# Patient Record
Sex: Male | Born: 1975 | Race: Black or African American | Hispanic: No | Marital: Married | Smoking: Never smoker
Health system: Southern US, Community
[De-identification: ages and names within clinical notes are randomized; demographics above are authoritative.]

---

## 2015-12-24 DIAGNOSIS — I4891 Unspecified atrial fibrillation: Principal | ICD-10-CM | POA: Diagnosis present

## 2015-12-24 DIAGNOSIS — N179 Acute kidney failure, unspecified: Secondary | ICD-10-CM | POA: Diagnosis present

## 2015-12-25 ENCOUNTER — Encounter (HOSPITAL_COMMUNITY): Payer: Self-pay

## 2015-12-25 ENCOUNTER — Emergency Department (HOSPITAL_COMMUNITY): Payer: Self-pay

## 2015-12-25 ENCOUNTER — Inpatient Hospital Stay (HOSPITAL_COMMUNITY)
Admission: EM | Admit: 2015-12-25 | Discharge: 2015-12-25 | DRG: 309 | Disposition: A | Discharge status: Home / Self Care | Payer: Self-pay | Attending: Family Medicine | Admitting: Family Medicine

## 2015-12-25 ENCOUNTER — Other Ambulatory Visit: Payer: Self-pay

## 2015-12-25 ENCOUNTER — Inpatient Hospital Stay (HOSPITAL_COMMUNITY): Payer: Self-pay

## 2015-12-25 DIAGNOSIS — I4891 Unspecified atrial fibrillation: Principal | ICD-10-CM

## 2015-12-25 DIAGNOSIS — R9431 Abnormal electrocardiogram [ECG] [EKG]: Secondary | ICD-10-CM

## 2015-12-25 DIAGNOSIS — N179 Acute kidney failure, unspecified: Secondary | ICD-10-CM

## 2015-12-25 DIAGNOSIS — R748 Abnormal levels of other serum enzymes: Secondary | ICD-10-CM | POA: Diagnosis present

## 2015-12-25 DIAGNOSIS — I471 Supraventricular tachycardia: Secondary | ICD-10-CM

## 2015-12-25 DIAGNOSIS — R0789 Other chest pain: Secondary | ICD-10-CM | POA: Diagnosis present

## 2015-12-25 DIAGNOSIS — M6282 Rhabdomyolysis: Secondary | ICD-10-CM

## 2015-12-25 LAB — I-STAT CHEM 8, ED
BUN: 32 mg/dL — ABNORMAL HIGH (ref 6–20)
CREATININE: 1.5 mg/dL — AB (ref 0.61–1.24)
Calcium, Ion: 1.13 mmol/L — ABNORMAL LOW (ref 1.15–1.40)
Chloride: 103 mmol/L (ref 101–111)
Glucose, Bld: 82 mg/dL (ref 65–99)
HEMATOCRIT: 47 % (ref 39.0–52.0)
HEMOGLOBIN: 16 g/dL (ref 13.0–17.0)
POTASSIUM: 4.6 mmol/L (ref 3.5–5.1)
SODIUM: 137 mmol/L (ref 135–145)
TCO2: 27 mmol/L (ref 0–100)

## 2015-12-25 LAB — SODIUM, URINE, RANDOM: Sodium, Ur: 72 mmol/L

## 2015-12-25 LAB — RAPID URINE DRUG SCREEN, HOSP PERFORMED
AMPHETAMINES: NOT DETECTED
BARBITURATES: NOT DETECTED
Benzodiazepines: NOT DETECTED
Cocaine: NOT DETECTED
OPIATES: NOT DETECTED
TETRAHYDROCANNABINOL: NOT DETECTED

## 2015-12-25 LAB — CBC
HEMATOCRIT: 39.5 % (ref 39.0–52.0)
Hemoglobin: 13.1 g/dL (ref 13.0–17.0)
MCH: 28.4 pg (ref 26.0–34.0)
MCHC: 33.2 g/dL (ref 30.0–36.0)
MCV: 85.7 fL (ref 78.0–100.0)
Platelets: 202 10*3/uL (ref 150–400)
RBC: 4.61 MIL/uL (ref 4.22–5.81)
RDW: 13.1 % (ref 11.5–15.5)
WBC: 7.2 10*3/uL (ref 4.0–10.5)

## 2015-12-25 LAB — BASIC METABOLIC PANEL
Anion gap: 6 (ref 5–15)
Anion gap: 6 (ref 5–15)
BUN: 21 mg/dL — AB (ref 6–20)
BUN: 14 mg/dL (ref 6–20)
CHLORIDE: 106 mmol/L (ref 101–111)
CHLORIDE: 105 mmol/L (ref 101–111)
CO2: 25 mmol/L (ref 22–32)
CO2: 25 mmol/L (ref 22–32)
CREATININE: 1.24 mg/dL (ref 0.61–1.24)
Calcium: 8.5 mg/dL — ABNORMAL LOW (ref 8.9–10.3)
Calcium: 8.6 mg/dL — ABNORMAL LOW (ref 8.9–10.3)
Creatinine, Ser: 1.33 mg/dL — ABNORMAL HIGH (ref 0.61–1.24)
GFR calc Af Amer: >60 mL/min (ref >60)
GFR calc Af Amer: >60 mL/min (ref >60)
GFR calc non Af Amer: >60 mL/min (ref >60)
GFR calc non Af Amer: >60 mL/min (ref >60)
Glucose, Bld: 107 mg/dL — ABNORMAL HIGH (ref 65–99)
Glucose, Bld: 113 mg/dL — ABNORMAL HIGH (ref 65–99)
POTASSIUM: 3.6 mmol/L (ref 3.5–5.1)
POTASSIUM: 4.1 mmol/L (ref 3.5–5.1)
SODIUM: 137 mmol/L (ref 135–145)
Sodium: 136 mmol/L (ref 135–145)

## 2015-12-25 LAB — CBC WITH DIFFERENTIAL/PLATELET
BASOS ABS: 0 10*3/uL (ref 0.0–0.1)
BASOS PCT: 0 %
EOS ABS: 0.1 10*3/uL (ref 0.0–0.7)
EOS PCT: 1 %
HCT: 45.2 % (ref 39.0–52.0)
HEMOGLOBIN: 15.3 g/dL (ref 13.0–17.0)
LYMPHS ABS: 2.9 10*3/uL (ref 0.7–4.0)
Lymphocytes Relative: 35 %
MCH: 28.7 pg (ref 26.0–34.0)
MCHC: 33.8 g/dL (ref 30.0–36.0)
MCV: 84.6 fL (ref 78.0–100.0)
Monocytes Absolute: 0.5 10*3/uL (ref 0.1–1.0)
Monocytes Relative: 7 %
NEUTROS PCT: 57 %
Neutro Abs: 4.6 10*3/uL (ref 1.7–7.7)
PLATELETS: 242 10*3/uL (ref 150–400)
RBC: 5.34 MIL/uL (ref 4.22–5.81)
RDW: 13.1 % (ref 11.5–15.5)
WBC: 8.1 10*3/uL (ref 4.0–10.5)

## 2015-12-25 LAB — LIPID PANEL
CHOLESTEROL: 190 mg/dL (ref 0–200)
HDL: 41 mg/dL (ref >40)
LDL CALC: 123 mg/dL — AB (ref 0–99)
TRIGLYCERIDES: 128 mg/dL (ref <150)
Total CHOL/HDL Ratio: 4.6 RATIO
VLDL: 26 mg/dL (ref 0–40)

## 2015-12-25 LAB — COMPREHENSIVE METABOLIC PANEL
ALBUMIN: 4.7 g/dL (ref 3.5–5.0)
ALT: 37 U/L (ref 17–63)
AST: 44 U/L — AB (ref 15–41)
Alkaline Phosphatase: 47 U/L (ref 38–126)
Anion gap: 6 (ref 5–15)
BUN: 23 mg/dL — AB (ref 6–20)
CHLORIDE: 104 mmol/L (ref 101–111)
CO2: 24 mmol/L (ref 22–32)
CREATININE: 1.54 mg/dL — AB (ref 0.61–1.24)
Calcium: 9.5 mg/dL (ref 8.9–10.3)
GFR calc Af Amer: >60 mL/min (ref >60)
GFR, EST NON AFRICAN AMERICAN: 54 mL/min — AB (ref >60)
Glucose, Bld: 87 mg/dL (ref 65–99)
POTASSIUM: 4.4 mmol/L (ref 3.5–5.1)
SODIUM: 134 mmol/L — AB (ref 135–145)
Total Bilirubin: 0.6 mg/dL (ref 0.3–1.2)
Total Protein: 8.5 g/dL — ABNORMAL HIGH (ref 6.5–8.1)

## 2015-12-25 LAB — CK
Total CK: 844 U/L — ABNORMAL HIGH (ref 49–397)
Total CK: 943 U/L — ABNORMAL HIGH (ref 49–397)

## 2015-12-25 LAB — ECHOCARDIOGRAM COMPLETE
HEIGHTINCHES: 75 in
WEIGHTICAEL: 4848 [oz_av]

## 2015-12-25 LAB — TROPONIN I
TROPONIN I: 0.03 ng/mL — AB (ref <0.03)
Troponin I: 0.07 ng/mL (ref <0.03)
Troponin I: 0.05 ng/mL (ref <0.03)

## 2015-12-25 LAB — PROTIME-INR
INR: 0.97
Prothrombin Time: 12.9 seconds (ref 11.4–15.2)

## 2015-12-25 LAB — CREATININE, URINE, RANDOM: Creatinine, Urine: 81.64 mg/dL

## 2015-12-25 LAB — I-STAT TROPONIN, ED: TROPONIN I, POC: 0 ng/mL (ref 0.00–0.08)

## 2015-12-25 LAB — T4, FREE: FREE T4: 0.87 ng/dL (ref 0.61–1.12)

## 2015-12-25 LAB — TSH: TSH: 2.675 u[IU]/mL (ref 0.350–4.500)

## 2015-12-25 MED ORDER — APIXABAN 5 MG PO TABS: 5.0000 mg | ORAL_TABLET | Freq: Two times a day (BID) | ORAL | 0 refills | Status: AC

## 2015-12-25 MED ORDER — ASPIRIN 81 MG PO CHEW
324.0000 mg | CHEWABLE_TABLET | Freq: Once | ORAL | Status: AC
  Administered 2015-12-25: 324 mg via ORAL
  Filled 2015-12-25: qty 4

## 2015-12-25 MED ORDER — ACETAMINOPHEN 325 MG PO TABS: 650.0000 mg | ORAL_TABLET | ORAL | Status: DC | PRN

## 2015-12-25 MED ORDER — METOPROLOL TARTRATE 25 MG PO TABS: 12.5000 mg | ORAL_TABLET | Freq: Two times a day (BID) | ORAL | 0 refills | Status: AC

## 2015-12-25 MED ORDER — ADENOSINE 6 MG/2ML IV SOLN
INTRAVENOUS | Status: AC
  Administered 2015-12-25: 6 mg via INTRAVENOUS
  Filled 2015-12-25: qty 4

## 2015-12-25 MED ORDER — DILTIAZEM HCL 90 MG PO TABS
180.0000 mg | ORAL_TABLET | Freq: Two times a day (BID) | ORAL | Status: DC
  Administered 2015-12-25: 180 mg via ORAL
  Filled 2015-12-25: qty 2

## 2015-12-25 MED ORDER — SODIUM CHLORIDE 0.9 % IV SOLN
INTRAVENOUS | Status: DC
  Administered 2015-12-25 (×2): via INTRAVENOUS

## 2015-12-25 MED ORDER — ONDANSETRON HCL 4 MG/2ML IJ SOLN: 4.0000 mg | Freq: Four times a day (QID) | INTRAMUSCULAR | Status: DC | PRN

## 2015-12-25 MED ORDER — METOPROLOL TARTRATE 25 MG PO TABS
12.5000 mg | ORAL_TABLET | Freq: Two times a day (BID) | ORAL | Status: DC
  Administered 2015-12-25 (×2): 12.5 mg via ORAL
  Filled 2015-12-25 (×2): qty 1

## 2015-12-25 MED ORDER — ZOLPIDEM TARTRATE 5 MG PO TABS: 5.0000 mg | ORAL_TABLET | Freq: Every evening | ORAL | Status: DC | PRN

## 2015-12-25 MED ORDER — DILTIAZEM HCL-DEXTROSE 100-5 MG/100ML-% IV SOLN (PREMIX)
5.0000 mg/h | INTRAVENOUS | Status: DC
  Administered 2015-12-25: 5 mg/h via INTRAVENOUS
  Filled 2015-12-25: qty 100

## 2015-12-25 MED ORDER — DILTIAZEM HCL ER COATED BEADS 180 MG PO CP24: 180.0000 mg | ORAL_CAPSULE | Freq: Every day | ORAL | 0 refills | Status: AC

## 2015-12-25 MED ORDER — DILTIAZEM LOAD VIA INFUSION
10.0000 mg | Freq: Once | INTRAVENOUS | Status: AC
  Administered 2015-12-25: 10 mg via INTRAVENOUS
  Filled 2015-12-25: qty 10

## 2015-12-25 MED ORDER — APIXABAN 5 MG PO TABS
5.0000 mg | ORAL_TABLET | Freq: Two times a day (BID) | ORAL | Status: DC
  Administered 2015-12-25 (×2): 5 mg via ORAL
  Filled 2015-12-25 (×3): qty 1

## 2015-12-25 MED ORDER — ADENOSINE 6 MG/2ML IV SOLN
INTRAVENOUS | Status: AC
  Administered 2015-12-25: 6 mg via INTRAVENOUS
  Filled 2015-12-25: qty 6

## 2015-12-25 MED ORDER — ASPIRIN 81 MG PO CHEW
324.0000 mg | CHEWABLE_TABLET | Freq: Every day | ORAL | Status: DC
  Administered 2015-12-25: 324 mg via ORAL
  Filled 2015-12-25: qty 4

## 2015-12-25 MED ORDER — METOPROLOL TARTRATE 5 MG/5ML IV SOLN
2.5000 mg | Freq: Once | INTRAVENOUS | Status: DC
  Filled 2015-12-25: qty 5

## 2015-12-25 MED ORDER — NITROGLYCERIN 0.4 MG SL SUBL: 0.4000 mg | SUBLINGUAL_TABLET | SUBLINGUAL | Status: DC | PRN

## 2015-12-25 MED ORDER — ADENOSINE 6 MG/2ML IV SOLN
INTRAVENOUS | Status: AC
  Administered 2015-12-25: 6 mg via INTRAVENOUS
  Filled 2015-12-25: qty 2

## 2015-12-25 MED ORDER — ALPRAZOLAM 0.25 MG PO TABS: 0.2500 mg | ORAL_TABLET | Freq: Two times a day (BID) | ORAL | Status: DC | PRN

## 2015-12-25 MED ORDER — SODIUM CHLORIDE 0.9 % IV BOLUS (SEPSIS)
1000.0000 mL | Freq: Once | INTRAVENOUS | Status: AC
  Administered 2015-12-25: 1000 mL via INTRAVENOUS

## 2015-12-25 NOTE — ED Provider Notes (Signed)
[facility]-EMERGENCY DEPT Provider Note   CSN: [id number] Arrival date & time: 12/24/15  2349  By signing my name below, I, Christy Sartorius, attest that this documentation has been prepared under the direction and in the presence of Calisha Tindel, MD . Electronically Signed: Christy Sartorius, Scribe. 12/25/2015. 12:31 AM.  History   Chief Complaint Chief Complaint  Patient presents with  . Tachycardia  . Chest Pain    The history is provided by the patient and medical records. No language interpreter was used.  Palpitations   This is a new problem. The current episode started less than 1 hour ago. The problem occurs constantly. The problem has not changed since onset.The problem is associated with exercise and caffeine. Associated symptoms include diaphoresis, chest pain and irregular heartbeat. Pertinent negatives include no exertional chest pressure, no syncope, no abdominal pain, no nausea, no vomiting and no shortness of breath. He has tried nothing for the symptoms. The treatment provided no relief. There are no known risk factors. His past medical history does not include anemia, heart disease, hyperthyroidism or valve disorder.     HPI Comments:  Louis Jacobs is a 40 y.o. male who presents to the Emergency Department complaining of chest pain and tachycardia onset just PTA.  He describes his chest pain as pressure.  He took 1 scoop of Ultimate C4 powder he purchased at [facility] before his workout.  Per the nurse's note pt usually takes half a scoop and tonight he took a whole scoop.  Pt drank it though his workout.  He worked out for an hour lifting weight and bench pressing. Prior to tonight his last work out was on Sunday.  He notes he sometimes gets palpitations when taking the supplement and reports he has had similar episodes in the past.  He drank a Pepsi this morning, but does not normally drink excessive caffeine.  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  He immigrated  from [place] almost 10 yeas ago.  Pt denies EtOH consumption, smoking and illicit drug use.    History reviewed. No pertinent past medical history.  There are no active problems to display for this patient.   History reviewed. No pertinent surgical history.   Home Medications    Prior to Admission medications   Not on File    Family History Family History  Problem Relation Age of Onset  . Diabetes Father   . Hypertension Mother     Social History Social History  Substance Use Topics  . Smoking status: Never Smoker  . Smokeless tobacco: Never Used  . Alcohol use Yes     Comment: rare     Allergies   Review of patient's allergies indicates not on file.   Review of Systems Review of Systems  Constitutional: Positive for diaphoresis.  Respiratory: Negative for shortness of breath.   Cardiovascular: Positive for chest pain and palpitations. Negative for leg swelling and syncope.  Gastrointestinal: Negative for abdominal pain, nausea and vomiting.  All other systems reviewed and are negative.    Physical Exam Updated Vital Signs BP 150/73 (BP Location: Left Arm)   Pulse (!) 150   Resp 12   Ht 6\' 3"  (1.905 m)   Wt 294 lb (133.4 kg)   SpO2 98%   BMI 36.75 kg/m   Physical Exam  Constitutional: He is oriented to person, place, and time. He appears well-developed and well-nourished. No distress.  HENT:  Head: Normocephalic and atraumatic.  Mouth/Throat: Oropharynx is  clear and moist. No oropharyngeal exudate.  Moist mucous membranes.  Eyes: Conjunctivae and EOM are normal. Pupils are equal, round, and reactive to light.  Neck: Normal range of motion. Neck supple. No JVD present. No tracheal deviation present.  Trachea midline No bruit  Cardiovascular: Regular rhythm, normal heart sounds and intact distal pulses.  Tachycardia present.   Extreme tachycardia.    Pulmonary/Chest: Effort normal and breath sounds normal. No stridor. No respiratory distress. He has  no wheezes.  Abdominal: Soft. Bowel sounds are normal. He exhibits no distension and no mass. There is no tenderness. There is no guarding.  Umbilical hernia, fat containing, easily reducible.  Musculoskeletal: Normal range of motion. He exhibits no edema.  Neurological: He is alert and oriented to person, place, and time. He has normal reflexes.  Skin: Skin is warm. Capillary refill takes less than 2 seconds. He is diaphoretic.  Psychiatric: He has a normal mood and affect. His behavior is normal.  Nursing note and vitals reviewed.  ED Treatments / Results   Vitals:   12/25/15 0100 12/25/15 0130  BP: 123/56 132/83  Pulse:    Resp: 17 17   Results for orders placed or performed during the hospital encounter of 12/25/15  CBC with Differential/Platelet  Result Value Ref Range   WBC 8.1 4.0 - 10.5 K/uL   RBC 5.34 4.22 - 5.81 MIL/uL   Hemoglobin 15.3 13.0 - 17.0 g/dL   HCT 56.245.2 13.039.0 - 86.552.0 %   MCV 84.6 78.0 - 100.0 fL   MCH 28.7 26.0 - 34.0 pg   MCHC 33.8 30.0 - 36.0 g/dL   RDW 78.413.1 69.611.5 - 29.515.5 %   Platelets 242 150 - 400 K/uL   Neutrophils Relative % 57 %   Neutro Abs 4.6 1.7 - 7.7 K/uL   Lymphocytes Relative 35 %   Lymphs Abs 2.9 0.7 - 4.0 K/uL   Monocytes Relative 7 %   Monocytes Absolute 0.5 0.1 - 1.0 K/uL   Eosinophils Relative 1 %   Eosinophils Absolute 0.1 0.0 - 0.7 K/uL   Basophils Relative 0 %   Basophils Absolute 0.0 0.0 - 0.1 K/uL  Comprehensive metabolic panel  Result Value Ref Range   Sodium 134 (L) 135 - 145 mmol/L   Potassium 4.4 3.5 - 5.1 mmol/L   Chloride 104 101 - 111 mmol/L   CO2 24 22 - 32 mmol/L   Glucose, Bld 87 65 - 99 mg/dL   BUN 23 (H) 6 - 20 mg/dL   Creatinine, Ser 2.841.54 (H) 0.61 - 1.24 mg/dL   Calcium 9.5 8.9 - 13.210.3 mg/dL   Total Protein 8.5 (H) 6.5 - 8.1 g/dL   Albumin 4.7 3.5 - 5.0 g/dL   AST 44 (H) 15 - 41 U/L   ALT 37 17 - 63 U/L   Alkaline Phosphatase 47 38 - 126 U/L   Total Bilirubin 0.6 0.3 - 1.2 mg/dL   GFR calc non Af Amer 54 (L)  >60 mL/min   GFR calc Af Amer >60 >60 mL/min   Anion gap 6 5 - 15  Rapid urine drug screen (hospital performed)  Result Value Ref Range   Opiates NONE DETECTED NONE DETECTED   Cocaine NONE DETECTED NONE DETECTED   Benzodiazepines NONE DETECTED NONE DETECTED   Amphetamines NONE DETECTED NONE DETECTED   Tetrahydrocannabinol NONE DETECTED NONE DETECTED   Barbiturates NONE DETECTED NONE DETECTED  CK  Result Value Ref Range   Total CK 943 (H) 49 - 397 U/L  I-stat troponin, ED  Result Value Ref Range   Troponin i, poc 0.00 0.00 - 0.08 ng/mL   Comment 3          I-stat chem 8, ed  Result Value Ref Range   Sodium 137 135 - 145 mmol/L   Potassium 4.6 3.5 - 5.1 mmol/L   Chloride 103 101 - 111 mmol/L   BUN 32 (H) 6 - 20 mg/dL   Creatinine, Ser 6.21 (H) 0.61 - 1.24 mg/dL   Glucose, Bld 82 65 - 99 mg/dL   Calcium, Ion 3.08 (L) 1.15 - 1.40 mmol/L   TCO2 27 0 - 100 mmol/L   Hemoglobin 16.0 13.0 - 17.0 g/dL   HCT 65.7 84.6 - 96.2 %   Dg Chest Portable 1 View  Result Date: 12/25/2015 CLINICAL DATA:  Acute onset of generalized chest pain. Initial encounter. EXAM: PORTABLE CHEST 1 VIEW COMPARISON:  None. FINDINGS: The lungs are well-aerated. Vascular congestion is noted. There is no evidence of focal opacification, pleural effusion or pneumothorax. The cardiomediastinal silhouette is borderline normal in size. No acute osseous abnormalities are seen. IMPRESSION: Vascular congestion noted.  Lungs remain grossly clear. Electronically Signed   By: Roanna Raider M.D.   On: 12/25/2015 01:06     DIAGNOSTIC STUDIES:  Oxygen Saturation is 98% on RA, NML by my interpretation.    COORDINATION OF CARE:  12:20 AM Discussed treatment plan with pt at bedside and pt agreed to plan.  EKG  EKG Interpretation  Date/Time:  Friday December 24 2015 23:58:17 EDT Ventricular Rate:  135 PR Interval:    QRS Duration: 84 QT Interval:  314 QTC Calculation: 455 R Axis:   10 Text Interpretation:  Atrial  flutter/fibrillation Probable anteroseptal infarct, old Minimal ST elevation, lateral leads No old tracing to compare Confirmed by [facility]  MD, DAVID ([id number]) on 12/25/2015 12:01:45 AM      Procedures Procedures (including critical care time)  Medications Ordered in ED Medications  diltiazem (CARDIZEM) 1 mg/mL load via infusion 10 mg (10 mg Intravenous Bolus from Bag 12/25/15 0056)    And  diltiazem (CARDIZEM) 100 mg in dextrose 5% (1 mg/mL) infusion (5 mg/hr Intravenous New Bag/Given 12/25/15 0056)  aspirin chewable tablet 324 mg (not administered)  nitroGLYCERIN (NITROSTAT) SL tablet 0.4 mg (not administered)  0.9 %  sodium chloride infusion (not administered)  acetaminophen (TYLENOL) tablet 650 mg (not administered)  ondansetron (ZOFRAN) injection 4 mg (not administered)  apixaban (ELIQUIS) tablet 5 mg (not administered)  zolpidem (AMBIEN) tablet 5 mg (not administered)  ALPRAZolam (XANAX) tablet 0.25 mg (not administered)  metoprolol tartrate (LOPRESSOR) tablet 12.5 mg (not administered)  adenosine (ADENOCARD) 6 MG/2ML injection (6 mg Intravenous Given 12/25/15 0005)  adenosine (ADENOCARD) 6 MG/2ML injection (6 mg Intravenous Given 12/25/15 0058)  adenosine (ADENOCARD) 6 MG/2ML injection (6 mg Intravenous Given 12/25/15 0058)  sodium chloride 0.9 % bolus 1,000 mL (0 mLs Intravenous Stopped 12/25/15 0130)  aspirin chewable tablet 324 mg (324 mg Oral Given 12/25/15 0041)   MDM Reviewed: nursing note and vitals Interpretation: labs, ECG and x-ray (NACPD on CXR, normal troponin elevated BUN CR and CK by me.  ) Total time providing critical care: > 105 minutes (multiple adenosines and diltiazem bolus and drip). This excludes time spent performing separately reportable procedures and services. Consults: admitting MD and cardiology  CRITICAL CARE Performed by: Jasmine Awe Total critical care time: 120 minutes Critical care time was exclusive of separately billable procedures and  treating other patients. Critical  care was necessary to treat or prevent imminent or life-threatening deterioration. Critical care was time spent personally by me on the following activities: development of treatment plan with patient and/or surrogate as well as nursing, discussions with consultants, evaluation of patient's response to treatment, examination of patient, obtaining history from patient or surrogate, ordering and performing treatments and interventions, ordering and review of laboratory studies, ordering and review of radiographic studies, pulse oximetry and re-evaluation of patient's condition.   Initial Impression / Assessment and Plan / ED Course    Final Clinical Impressions(s) / ED Diagnoses  SVT afib with RVR abnormal  1235 Case discussed with Dr. Nadara EatonGangi via phone who reviewed all EKGs and states this is not a STEMI. Admit for serial enzymes and anticoagulation for cardioversion in the future  New Prescriptions New Prescriptions   No medications on file   I personally performed the services described in this documentation, which was scribed in my presence. The recorded information has been reviewed and is accurate.      Cy BlamerApril Rooney Swails, MD 12/25/15 [phone]

## 2015-12-25 NOTE — ED Triage Notes (Signed)
Patient c/o chest pain that began tonight after he took a workout supplement.  Patient states that he usually only takes half of the shake but decided to take the whole shake tonight.  Patient states has central chest pain that feels life pressure.  The pain does not go anywhere.

## 2015-12-25 NOTE — Progress Notes (Signed)
CRITICAL VALUE ALERT  Critical value received:  Troponin 0.07  Date of notification:  12/25/2015  Time of notification:  0921  Critical value read back:Yes.    Nurse who received alert:  Driscilla MoatsBriana Keziah Avis, RN  MD notified (1st page):  Dr. Edward JollySilva  Time of first page: [phone]   MD notified (2nd page):  Time of second page:  Responding MD:  Dr. Edward JollySilva  Time MD responded:  [phone]

## 2015-12-25 NOTE — Progress Notes (Signed)
  Echocardiogram 2D Echocardiogram has been performed.  Janalyn HarderWest, Abeeha Twist R 12/25/2015, 2:21 PM

## 2015-12-25 NOTE — ED Notes (Signed)
Patient to Room 18-awake and alert/diaphoretic-hypotensive-connected to hardwire monitor-MP shows atrial tachycardia with rate 233-Dr. Palumbo at bedside to evaluate patient-PIV initiated and bloodwork drawn-vagal manuevers performed with no change in rate-administered adenocard 6 mg with no change in rate-then administered 12 mg adenocard with rapid flushes after each dose of adenocard-fluid boluses-2nd IV initiated-rate decreased with MP from ?atrial flutter with 1:1 conduction to atrial fib with RVR 130. Patient states chest discomfort has resolved.

## 2015-12-25 NOTE — H&P (Signed)
History and Physical    Louis Jacobs ZOX:[id number] DOB: [birth year] DOA: 12/25/2015  Referring MD/NP/PA:   PCP: No primary care provider on file.   Patient coming from:  The patient is coming from home.  At baseline, pt is independent for most of ADL.   Chief Complaint: Palpitation, chest pressure, diaphoresis  HPI: Louis Jacobs is a 40 y.o. male without significant medical history, presenting with palpitation, chest pressure, diaphoresis.  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 He took 1 scoop of double strength Ultimate C4 powder he purchased at [facility]. He states that he started having mild chest discomfort and chest pressure, palpitation tonight and diaphoresis. He was found to have tachycardia with heart rate up to 240 in ED. He was treated vagal maneuvers by EDP, with no change in heart rate. Then was administered adenocard 6 mg with no change in rate, then administered 12 mg adenosine with improved heart rate and symptoms. EKG showed A flutter/a fib with RVR. When I saw pt in ED, his symptoms has largely resolved. He does not have chest pain, shortness of breath, nausea, vomiting, abdominal pain, diarrhea, symptoms of a UTI. No fever or chills. Pt denies EtOH consumption, smoking and illicit drug use.    ED Course: pt was found to have WBC 8.1, troponin negative, UDS negative, tachycardia, saturation 99% on room air currently, AKI with Cre 1.54, CK 943. Pt is admitted to stepdown as inpatient. IV Cardizem drip was started. Cardiology, Dr. Voncille Lo was consulted.  Review of Systems:   General: no fevers, chills, no changes in body weight, has fatigue HEENT: no blurry vision, hearing changes or sore throat Respiratory: no dyspnea, coughing, wheezing CV: has chest pressure and no palpitations GI: no nausea, vomiting, abdominal pain, diarrhea, constipation GU: no dysuria, burning on urination, increased  urinary frequency, hematuria  Ext: no leg edema Neuro: no unilateral weakness, numbness, or tingling, no vision change or hearing loss Skin: no rash, no skin tear. MSK: No muscle spasm, no deformity, no limitation of range of movement in spin Heme: No easy bruising.  Travel history: No recent long distant travel.  Allergy:  Allergies  Allergen Reactions  . Chloroquine Rash    History reviewed. No pertinent past medical history.  History reviewed. No pertinent surgical history.  Social History:  reports that he has never smoked. He has never used smokeless tobacco. He reports that he drinks alcohol. He reports that he does not use drugs.  Family History:  Family History  Problem Relation Age of Onset  . Diabetes Father   . Hypertension Mother      Prior to Admission medications   Medication Sig Start Date End Date Taking? Authorizing Provider  aspirin 325 MG tablet Take 650 mg by mouth every 6 (six) hours as needed for mild pain or moderate pain.   Yes Historical Provider, MD    Physical Exam: Vitals:   12/25/15 0030 12/25/15 0045 12/25/15 0100 12/25/15 0130  BP: 119/77 119/77 123/56 132/83  Pulse:  115    Resp: 17 17 17 17   TempSrc:      SpO2: 97% 99% 99% 100%  Weight:      Height:       General: Not in acute distress HEENT:       Eyes: PERRL, EOMI, no scleral icterus.       ENT: No discharge from the ears and nose, no  pharynx injection, no tonsillar enlargement.        Neck: No JVD, no bruit, no mass felt. Heme: No neck lymph node enlargement. Cardiac: S1/S2, irregularly irregular rhythm, No murmurs, No gallops or rubs. Respiratory:  No rales, wheezing, rhonchi or rubs. GI: Soft, nondistended, nontender, no rebound pain, no organomegaly, BS present. GU: No hematuria Ext: No pitting leg edema bilaterally. 2+DP/PT pulse bilaterally. Musculoskeletal: No joint deformities, No joint redness or warmth, no limitation of ROM in spin. Skin: No rashes.  Neuro: Alert,  oriented X3, cranial nerves II-XII grossly intact, moves all extremities normally.  Psych: Patient is not psychotic, no suicidal or hemocidal ideation.  Labs on Admission: I have personally reviewed following labs and imaging studies  CBC:  Recent Labs Lab 12/25/15 0025 12/25/15 0043  WBC 8.1  --   NEUTROABS 4.6  --   HGB 15.3 16.0  HCT 45.2 47.0  MCV 84.6  --   PLT 242  --    Basic Metabolic Panel:  Recent Labs Lab 12/25/15 0025 12/25/15 0043  NA 134* 137  K 4.4 4.6  CL 104 103  CO2 24  --   GLUCOSE 87 82  BUN 23* 32*  CREATININE 1.54* 1.50*  CALCIUM 9.5  --    GFR: Estimated Creatinine Clearance: 95.4 mL/min (by C-G formula based on SCr of 1.5 mg/dL (H)). Liver Function Tests:  Recent Labs Lab 12/25/15 0025  AST 44*  ALT 37  ALKPHOS 47  BILITOT 0.6  PROT 8.5*  ALBUMIN 4.7   No results for input(s): LIPASE, AMYLASE in the last 168 hours. No results for input(s): AMMONIA in the last 168 hours. Coagulation Profile: No results for input(s): INR, PROTIME in the last 168 hours. Cardiac Enzymes:  Recent Labs Lab 12/25/15 0025  CKTOTAL 943*   BNP (last 3 results) No results for input(s): PROBNP in the last 8760 hours. HbA1C: No results for input(s): HGBA1C in the last 72 hours. CBG: No results for input(s): GLUCAP in the last 168 hours. Lipid Profile: No results for input(s): CHOL, HDL, LDLCALC, TRIG, CHOLHDL, LDLDIRECT in the last 72 hours. Thyroid Function Tests: No results for input(s): TSH, T4TOTAL, FREET4, T3FREE, THYROIDAB in the last 72 hours. Anemia Panel: No results for input(s): VITAMINB12, FOLATE, FERRITIN, TIBC, IRON, RETICCTPCT in the last 72 hours. Urine analysis: No results found for: COLORURINE, APPEARANCEUR, LABSPEC, PHURINE, GLUCOSEU, HGBUR, BILIRUBINUR, KETONESUR, PROTEINUR, UROBILINOGEN, NITRITE, LEUKOCYTESUR Sepsis Labs: @LABRCNTIP (procalcitonin:4,lacticidven:4) )No results found for this or any previous visit (from the past 240  hour(s)).   Radiological Exams on Admission: Dg Chest Portable 1 View  Result Date: 12/25/2015 CLINICAL DATA:  Acute onset of generalized chest pain. Initial encounter. EXAM: PORTABLE CHEST 1 VIEW COMPARISON:  None. FINDINGS: The lungs are well-aerated. Vascular congestion is noted. There is no evidence of focal opacification, pleural effusion or pneumothorax. The cardiomediastinal silhouette is borderline normal in size. No acute osseous abnormalities are seen. IMPRESSION: Vascular congestion noted.  Lungs remain grossly clear. Electronically Signed   By: Roanna RaiderJeffery  Chang M.D.   On: 12/25/2015 01:06     EKG: Independently reviewed. Afib/flutter with RVR   Assessment/Plan Principal Problem:   New onset atrial fibrillation (HCC) Active Problems:   Chest pressure   AKI (acute kidney injury) (HCC)   Elevated CK   New onset atrial fibrillation with RVR:  Etiology is not clear, but likely triggered by consumption of Ultimate C4 powder which has several stimulant components including caffeine. CHA2DS2-VASc Score is 0, dose no needs long  term oral anticoagulation. Heart rate has improved. Card, Dr. Voncille LoGonji was consulted--> recommended to start anticoagulant for a few days and cardioversion later.  - Admit to step down unit since patient needs Cardizem drip titrate - Control the heart rate with Cardizem gtt - start metoprolol 12.5 mg twice a day - Start ASA - NTG prn for chest pain/discomfort - cycle CEs, every 6 hours 3. - 2-D echo  - TSH and Free T4, T3 - RIsk stratify with FLP and A1c - EKG in am - f/u Card further recommendations  AKI: cre 1.54, likely due to elevated CK. - IVF: 1L NS, then 125 cc/h - Check FeNa - repeat CK in AM - Follow up renal function by BMP - Avoid ACEI and NSAIDs  DVT ppx: on Eliquis Code Status: Full code Family Communication: None at bed side.  Disposition Plan:  Anticipate discharge back to previous home environment Consults called:  Card, Dr.  Voncille LoGonji Admission status:   Inpatient/tele   Date of Service 12/25/2015    Lorretta HarpNIU, Anthem Frazer [facility] Pager [phone]  If 7PM-7AM, please contact night-coverage [email or web address] Password [email or web address] 12/25/2015, 2:18 AM

## 2015-12-25 NOTE — Progress Notes (Signed)
ANTICOAGULATION CONSULT NOTE - Initial Consult  Pharmacy Consult for Apixaban Indication: atrial fibrillation  Allergies  Allergen Reactions  . Chloroquine Rash    Patient Measurements: Height: 6\' 3"  (190.5 cm) Weight: 294 lb (133.4 kg) IBW/kg (Calculated) : 84.5  Vital Signs: Temp Source: Oral (11/03 2352) BP: 132/83 (11/04 0130) Pulse Rate: 115 (11/04 0045)  Labs:  Recent Labs  12/25/15 0025 12/25/15 0043  HGB 15.3 16.0  HCT 45.2 47.0  PLT 242  --   CREATININE 1.54* 1.50*  CKTOTAL 943*  --     Estimated Creatinine Clearance: 95.4 mL/min (by C-G formula based on SCr of 1.5 mg/dL (H)).   Medical History: History reviewed. No pertinent past medical history.  Medications:  Scheduled:  . apixaban  5 mg Oral BID  . aspirin  324 mg Oral Daily   Infusions:  . sodium chloride    . diltiazem (CARDIZEM) infusion 5 mg/hr (12/25/15 0056)    Assessment:  [age] yr male with new onset AFib with RVR  Patient on no oral anticoagulant PTA  Pharmacy consulted to dose Apixaban  Goal of Therapy:  Full anticoagulation Monitor platelets by anticoagulation protocol: Yes   Plan:  Apixaban 5mg  po BID Apixaban education to be provided  Maryellen PilePoindexter, Alonzo Loving Trefz, PharmD 12/25/2015,2:21 AM

## 2015-12-25 NOTE — Progress Notes (Addendum)
CRITICAL VALUE ALERT  Critical value received:  Troponin 0.03  Date of notification:  12/25/2015   Time of notification:  0306  Critical value read back:Yes.    Nurse who received alert:  Claudie ReveringKatie Dunn RN  MD notified (1st page):   M. Lynch  Time of first page:  0321  MD notified (2nd page):  Time of second page:  Responding MD:  M. Burnadette PeterLynch   Time MD responded:  [phone]

## 2015-12-25 NOTE — Discharge Summary (Signed)
Physician Discharge Summary  Chanc Kervin  VWU:[id number]  DOB: August 01, 1975  DOA: 12/25/2015 PCP: No primary care provider on file.  Admit date: 12/25/2015 Discharge date: 12/25/2015  Admitted From: Home  Disposition:  Home   Recommendations for Outpatient Follow-up:  1. Follow up with PCP in 1-2 weeks 2. Please obtain BMP/CBC in one week  Home Health: None  Equipment/Devices: None   Discharge Condition: Stable  CODE STATUS: FULL Diet recommendation: Heart Healthy   Brief/Interim Summary: Louis Jacobs is a 40 y.o. 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 He took 1 scoop of double strength Ultimate C4powder he purchased at [facility]. He states that he started having mild chest discomfort and chest pressure, palpitation tonight and diaphoresis. He was found to have tachycardia with heart rate up to 240 in ED. He was treated vagal maneuvers by EDP, with no change in heart rate. Then was administered adenocard 6 mg with no change in rate, then administered 12 mg adenosine with improved heart rate and symptoms.  Patient was started on Cardizem drip TNI's were trended stay flat highest 0.07. Also found to have mild increase in creatinine and elevated CK. Cardiology Dr. Jacinto Halim was consulted advice patient can be seen as an outpatient. No EKG changes troponins are flat, heart rate control with Cardizem by mouth and metoprolol. Patient is symptomatic.  Creatinine improved with IV fluids. CK slightly elevated probably due to extreme workouts patient denies muscle pains or achiness.   Patient will be discharged home with Cardizem 180 mg a day, metoprolol 12.5 twice a day and Eliquis twice a day. He'll need to follow-up with Dr. Jacinto Halim in one week.   Subjective: Patient seen and examined at bedside. Has no complaints  this morning chest pain has resolved no palpitations no dizziness or shortness of breath.  Discharge Diagnoses:   New onset atrial fibrillation with RVR:  Etiology is not clear, but likely triggered by consumption of Ultimate C4powder which has several stimulant components including caffeine. CHA2DS2-VASc Score is 0, dose no needs long term oral anticoagulation. Heart rate has improved. Card, Dr. Voncille Lo was consulted--> recommended to start anticoagulant for a few days and cardioversion later if needed. Slight elevated troponin secondary to A. fib -Cardizem 180 mg a day -metoprolol 12.5 twice a day  -Eliquis twice a day. -Follow-up with cardiology in 1 week  AKI: cre 1.54, likely due to elevated CK ? rhabdo from working out and exercise stimulant and supplements - improved with IV fluids. Cr down to 1.24 -Encourage oral hydration -Follow-up with PMD   Discharge Instructions  Discharge Instructions    Call MD for:  difficulty breathing, headache or visual disturbances    Complete by:  As directed    Call MD for:  extreme fatigue    Complete by:  As directed    Call MD for:  hives    Complete by:  As directed    Call MD for:  persistant dizziness or light-headedness    Complete by:  As directed    Call MD for:  persistant nausea and vomiting    Complete by:  As directed    Call MD for:  redness, tenderness, or signs of infection (pain, swelling, redness, odor or green/yellow discharge around incision site)    Complete by:  As directed    Call MD for:  severe  uncontrolled pain    Complete by:  As directed    Call MD for:  temperature >100.4    Complete by:  As directed    Diet - low sodium heart healthy    Complete by:  As directed    Discharge instructions    Complete by:  As directed    Follow up with cardiology in 1 week Dr. Jacinto HalimGanji Avoid energetic supplements  Rest for 3-5 days   Increase activity slowly    Complete by:  As directed        Medication List    STOP  taking these medications   aspirin 325 MG tablet     TAKE these medications   apixaban 5 MG Tabs tablet Commonly known as:  ELIQUIS Take 1 tablet (5 mg total) by mouth 2 (two) times daily.   diltiazem 180 MG 24 hr capsule Commonly known as:  CARDIZEM CD Take 1 capsule (180 mg total) by mouth daily.   metoprolol tartrate 25 MG tablet Commonly known as:  LOPRESSOR Take 0.5 tablets (12.5 mg total) by mouth 2 (two) times daily.       Allergies  Allergen Reactions  . Chloroquine Rash    Consultations:  Cardiology - Dr Jacinto HalimGanji    Procedures/Studies: Dg Chest Portable 1 View  Result Date: 12/25/2015 CLINICAL DATA:  Acute onset of generalized chest pain. Initial encounter. EXAM: PORTABLE CHEST 1 VIEW COMPARISON:  None. FINDINGS: The lungs are well-aerated. Vascular congestion is noted. There is no evidence of focal opacification, pleural effusion or pneumothorax. The cardiomediastinal silhouette is borderline normal in size. No acute osseous abnormalities are seen. IMPRESSION: Vascular congestion noted.  Lungs remain grossly clear. Electronically Signed   By: Roanna RaiderJeffery  Chang M.D.   On: 12/25/2015 01:06    ECHO:  Study Conclusions  - Left ventricle: The cavity size was normal. Systolic function was   normal. The estimated ejection fraction was in the range of 55%   to 60%. Wall motion was normal; there were no regional wall   motion abnormalities. The study is not technically sufficient to   allow evaluation of LV diastolic function. - Left atrium: The atrium was normal in size. - Right atrium: The atrium was mildly dilated.   Discharge Exam: Vitals:   12/25/15 1136 12/25/15 1324  BP: (!) 104/59 117/66  Pulse: 69 75  Resp:  20  Temp:  97.5 F (36.4 C)   Vitals:   12/25/15 0325 12/25/15 0649 12/25/15 1136 12/25/15 1324  BP: 138/79 106/63 (!) 104/59 117/66  Pulse: 84 62 69 75  Resp: 18 18  20   Temp: 98.1 F (36.7 C) 98.8 F (37.1 C)  97.5 F (36.4 C)  TempSrc: Oral  Oral  Oral  SpO2: 98% 99%  100%  Weight: (!) 137.4 kg (303 lb)     Height: 6\' 3"  (1.905 m)       General: Pt is alert, awake, not in acute distress Cardiovascular: RRR, S1/S2 +, no rubs, no gallops Respiratory: CTA bilaterally, no wheezing, no rhonchi Abdominal: Soft, NT, ND, bowel sounds + Extremities: no edema, no cyanosis   The results of significant diagnostics from this hospitalization (including imaging, microbiology, ancillary and laboratory) are listed below for reference.     Microbiology: No results found for this or any previous visit (from the past 240 hour(s)).   Labs: BNP (last 3 results) No results for input(s): BNP in the last 8760 hours. Basic Metabolic Panel:  Recent Labs Lab 12/25/15 0025 12/25/15  [id number] 12/25/15 0402 12/25/15 1433  NA 134* 137 137 136  K 4.4 4.6 3.6 4.1  CL 104 103 106 105  CO2 24  --  25 25  GLUCOSE 87 82 107* 113*  BUN 23* 32* 21* 14  CREATININE 1.54* 1.50* 1.33* 1.24  CALCIUM 9.5  --  8.5* 8.6*   Liver Function Tests:  Recent Labs Lab 12/25/15 0025  AST 44*  ALT 37  ALKPHOS 47  BILITOT 0.6  PROT 8.5*  ALBUMIN 4.7   No results for input(s): LIPASE, AMYLASE in the last 168 hours. No results for input(s): AMMONIA in the last 168 hours. CBC:  Recent Labs Lab 12/25/15 0025 12/25/15 0043 12/25/15 0402  WBC 8.1  --  7.2  NEUTROABS 4.6  --   --   HGB 15.3 16.0 13.1  HCT 45.2 47.0 39.5  MCV 84.6  --  85.7  PLT 242  --  202   Cardiac Enzymes:  Recent Labs Lab 12/25/15 0025 12/25/15 0226 12/25/15 0402 12/25/15 0825 12/25/15 1433  CKTOTAL 943*  --  844*  --   --   TROPONINI  --  0.03*  --  0.07* 0.05*   BNP: Invalid input(s): POCBNP CBG: No results for input(s): GLUCAP in the last 168 hours. D-Dimer No results for input(s): DDIMER in the last 72 hours. Hgb A1c No results for input(s): HGBA1C in the last 72 hours. Lipid Profile  Recent Labs  12/25/15 0402  CHOL 190  HDL 41  LDLCALC 123*  TRIG 128   CHOLHDL 4.6   Thyroid function studies  Recent Labs  12/25/15 0402  TSH 2.675   Anemia work up No results for input(s): VITAMINB12, FOLATE, FERRITIN, TIBC, IRON, RETICCTPCT in the last 72 hours. Urinalysis No results found for: COLORURINE, APPEARANCEUR, LABSPEC, PHURINE, GLUCOSEU, HGBUR, BILIRUBINUR, KETONESUR, PROTEINUR, UROBILINOGEN, NITRITE, LEUKOCYTESUR Sepsis Labs Invalid input(s): PROCALCITONIN,  WBC,  LACTICIDVEN Microbiology No results found for this or any previous visit (from the past 240 hour(s)).   Time coordinating discharge: Over 30 minutes  SIGNED:  Latrelle DodrillEdwin Silva, MD  [facility] 12/25/2015, 3:45 PM Pager   If 7PM-7AM, please contact night-coverage [email or web address] Password [email or web address]

## 2015-12-25 NOTE — ED Notes (Signed)
Admitting dr in with pt  

## 2015-12-26 LAB — HEMOGLOBIN A1C
Hgb A1c MFr Bld: 5.9 % — ABNORMAL HIGH (ref 4.8–5.6)
Mean Plasma Glucose: 123 mg/dL

## 2015-12-27 LAB — T3, FREE: T3, Free: 3.5 pg/mL (ref 2.0–4.4)

## 2018-02-10 IMAGING — DX DG CHEST 1V PORT
1 series · 1 of 1 positions shown · non-contrast
Comparison: None.

CLINICAL DATA: Acute onset of generalized chest pain. Initial
encounter.

EXAM:
PORTABLE CHEST 1 VIEW

[chest ap]
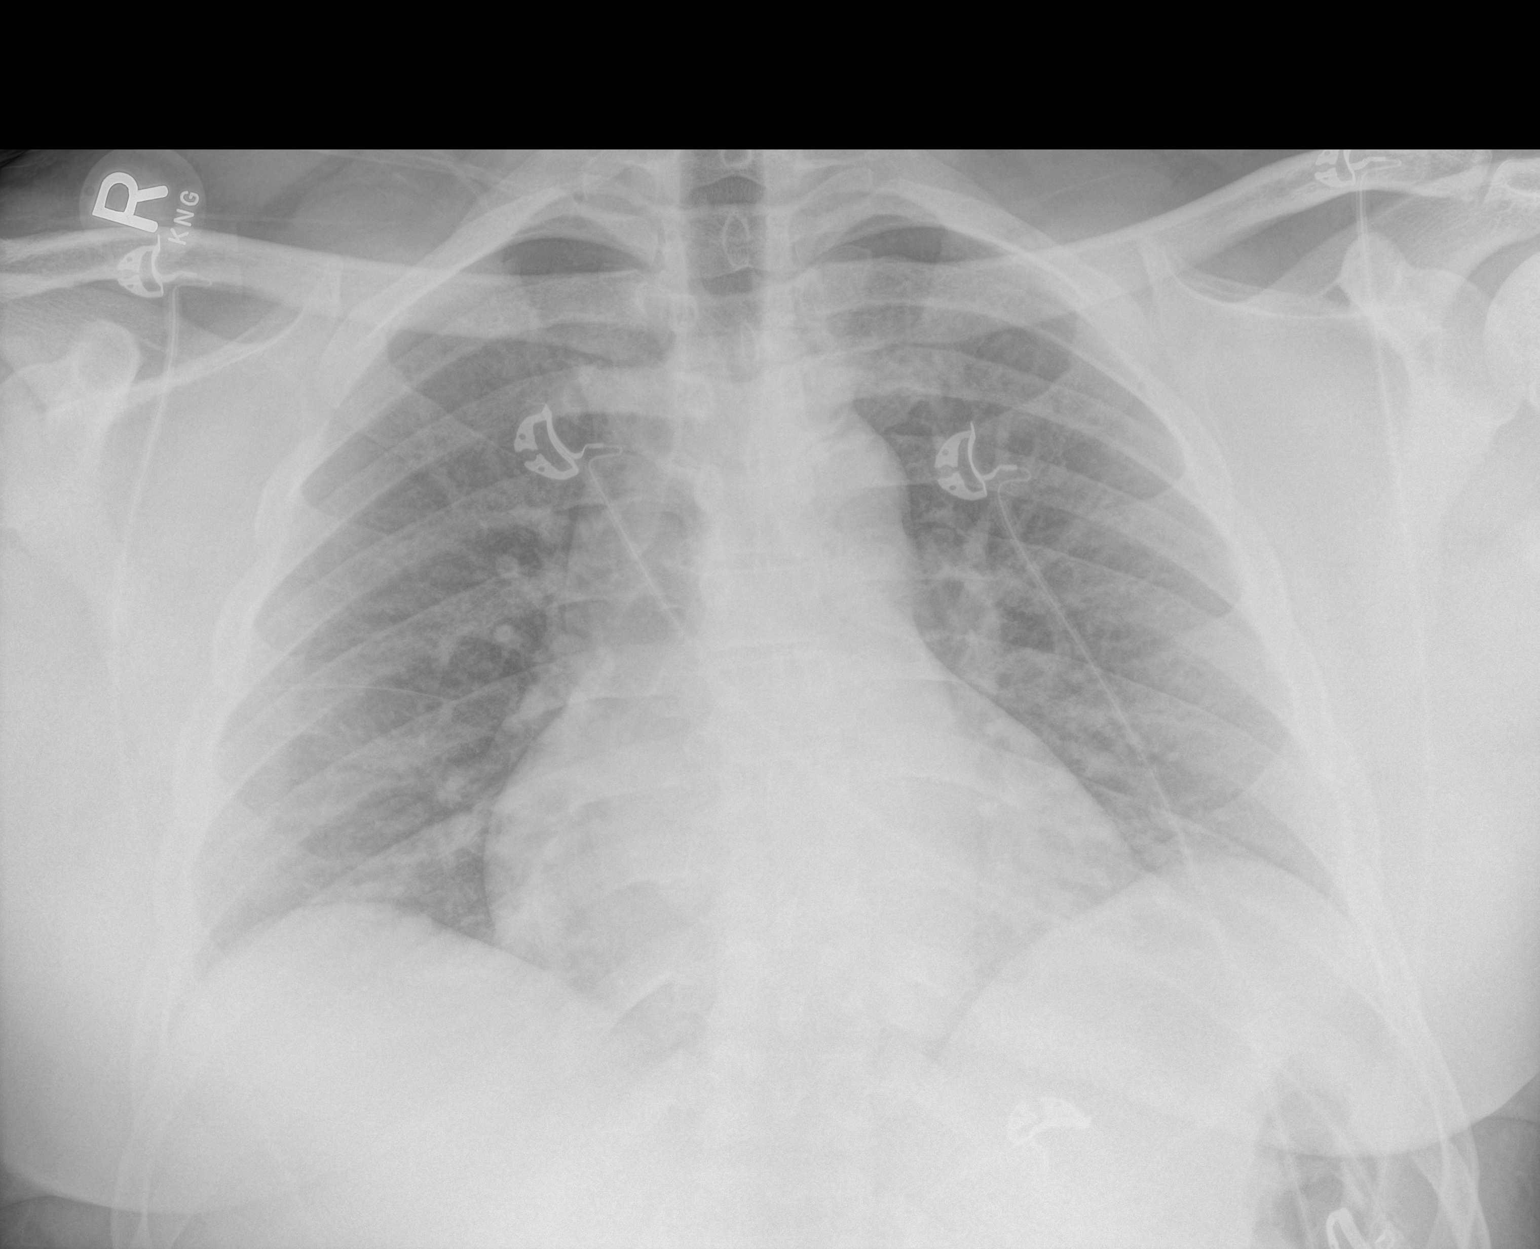

[1 of 1 positions shown; findings below may reference images not displayed]

FINDINGS: The lungs are well-aerated. Vascular congestion is noted. There is
no evidence of focal opacification, pleural effusion or
pneumothorax.

The cardiomediastinal silhouette is borderline normal in size. No
acute osseous abnormalities are seen.
IMPRESSION: Vascular congestion noted.  Lungs remain grossly clear.
